# Patient Record
Sex: Male | Born: 1980 | Race: Black or African American | Hispanic: No | Marital: Single | Smoking: Current every day smoker
Health system: Southern US, Community
[De-identification: ages and names within clinical notes are randomized; demographics above are authoritative.]

---

## 1999-10-26 ENCOUNTER — Emergency Department (HOSPITAL_COMMUNITY): Admission: EM | Admit: 1999-10-26 | Discharge: 1999-10-26 | Payer: Self-pay | Admitting: *Deleted

## 2000-02-23 ENCOUNTER — Emergency Department (HOSPITAL_COMMUNITY): Admission: EM | Admit: 2000-02-23 | Discharge: 2000-02-23 | Payer: Self-pay | Admitting: *Deleted

## 2005-04-29 ENCOUNTER — Emergency Department (HOSPITAL_COMMUNITY): Admission: EM | Admit: 2005-04-29 | Discharge: 2005-04-29 | Payer: Self-pay | Admitting: Emergency Medicine

## 2006-12-24 ENCOUNTER — Emergency Department (HOSPITAL_COMMUNITY): Admission: EM | Admit: 2006-12-24 | Discharge: 2006-12-24 | Payer: Self-pay | Admitting: Emergency Medicine

## 2012-09-13 ENCOUNTER — Emergency Department (HOSPITAL_COMMUNITY)
Admission: EM | Admit: 2012-09-13 | Discharge: 2012-09-13 | Disposition: A | Discharge status: Home / Self Care | Payer: Self-pay | Attending: Emergency Medicine | Admitting: Emergency Medicine

## 2012-09-13 ENCOUNTER — Encounter (HOSPITAL_COMMUNITY): Payer: Self-pay

## 2012-09-13 DIAGNOSIS — R369 Urethral discharge, unspecified: Secondary | ICD-10-CM | POA: Insufficient documentation

## 2012-09-13 DIAGNOSIS — Z202 Contact with and (suspected) exposure to infections with a predominantly sexual mode of transmission: Secondary | ICD-10-CM | POA: Insufficient documentation

## 2012-09-13 DIAGNOSIS — R300 Dysuria: Secondary | ICD-10-CM | POA: Insufficient documentation

## 2012-09-13 DIAGNOSIS — F17200 Nicotine dependence, unspecified, uncomplicated: Secondary | ICD-10-CM | POA: Insufficient documentation

## 2012-09-13 MED ORDER — AZITHROMYCIN 250 MG PO TABS
1000.0000 mg | ORAL_TABLET | Freq: Once | ORAL | Status: AC
  Administered 2012-09-13: 1000 mg via ORAL
  Filled 2012-09-13: qty 4

## 2012-09-13 MED ORDER — CEFTRIAXONE SODIUM 250 MG IJ SOLR
250.0000 mg | Freq: Once | INTRAMUSCULAR | Status: AC
  Administered 2012-09-13: 250 mg via INTRAMUSCULAR
  Filled 2012-09-13: qty 250

## 2012-09-13 MED ORDER — LIDOCAINE HCL 2 % IJ SOLN
INTRAMUSCULAR | Status: AC
  Administered 2012-09-13: 1.3 mL
  Filled 2012-09-13: qty 20

## 2012-09-13 NOTE — ED Provider Notes (Signed)
History     CSN: [id number]  Arrival date & time 09/13/12  1403   First MD Initiated Contact with Patient 09/13/12 1521      Chief Complaint  Patient presents with  . Exposure to STD    (Consider location/radiation/quality/duration/timing/severity/associated sxs/prior treatment) HPI Comments: Patient presents with dysuria and yellow discharge X 1 week. Patient is sexually active with one male partner and does not use protection. He states that his signifcant other admitted to going to the doctor two weeks ago and being treated for a yeast infection. Denies fever or chills. Denies hematuria or flank pain. Denies genital sores or testicular swelling.  The history is provided by the patient. No language interpreter was used.    No past medical history on file.  No past surgical history on file.  No family history on file.  History  Substance Use Topics  . Smoking status: Current Every Day Smoker  . Smokeless tobacco: Never Used  . Alcohol Use: 0.6 oz/week    1 Cans of beer per week      Review of Systems  Constitutional: Negative for fever and chills.  Genitourinary: Positive for dysuria and discharge. Negative for hematuria, flank pain, scrotal swelling and genital sores.    Allergies  Review of patient's allergies indicates no known allergies.  Home Medications  No current outpatient prescriptions on file.  BP 145/94  Pulse 81  Temp 99 F (37.2 C) (Oral)  Resp 14  SpO2 100%  Physical Exam  Nursing note and vitals reviewed. Constitutional: He appears well-developed and well-nourished.  HENT:  Head: Normocephalic and atraumatic.  Mouth/Throat: Oropharynx is clear and moist.  Eyes: Conjunctivae normal are normal. No scleral icterus.  Cardiovascular: Normal rate, regular rhythm and normal heart sounds.   Pulmonary/Chest: Effort normal and breath sounds normal.  Abdominal: Soft. There is no tenderness.  Genitourinary: Testes normal.    Right testis  shows no swelling. Left testis shows no swelling. Circumcised. Discharge found.  Neurological: He is alert.  Skin: Skin is warm.    ED Course  Procedures (including critical care time)   Labs Reviewed  GC/CHLAMYDIA PROBE AMP, URINE   No results found.   1. Exposure to STD       MDM  Patient presented with complaint of dysuria and penile discharge. GC/chalmydia probe collected via urine. Patient treated with IM Rocephin and Zithromax in ED. Discharged with return precautions and recommendation to get tested for HIV and syphilis. Return precautions given verbally and in discharge summary.        Pixie Casino, PA-C 09/13/12 1554  Pixie Casino, PA-C 09/13/12 1733

## 2012-09-13 NOTE — Progress Notes (Signed)
CM spoke with pt who confirms self pay [place] resident with no pcp. Discussed the importance of a pcp for f/u. Reviewed [facility] number to assist with finding self pay provider close to pt's residence. Reviewed resources for [facility], general medical clinics, [email or web address], [facility] out patient [facility], housing, DSS, health Department and other resources in [place]. Pt voiced understanding and appreciation of resources provided 

## 2012-09-13 NOTE — ED Notes (Signed)
Pt reports burning, pain and discharge with urination.  Pt reports yellowish discharge.  Pt denies chills/fever, n/v.

## 2012-09-13 NOTE — ED Notes (Signed)
Pt verbalizes iunderstanding

## 2012-09-14 NOTE — ED Provider Notes (Signed)
Medical screening examination/treatment/procedure(s) were performed by non-physician practitioner and as supervising physician I was immediately available for consultation/collaboration  Yousof Alderman R. Waino Mounsey, MD 09/14/12 0012 

## 2015-02-28 ENCOUNTER — Encounter (HOSPITAL_COMMUNITY): Payer: Self-pay | Admitting: Emergency Medicine

## 2015-02-28 ENCOUNTER — Emergency Department (HOSPITAL_COMMUNITY): Payer: Self-pay

## 2015-02-28 ENCOUNTER — Emergency Department (HOSPITAL_COMMUNITY)
Admission: EM | Admit: 2015-02-28 | Discharge: 2015-02-28 | Disposition: A | Discharge status: Home / Self Care | Payer: Self-pay | Attending: Emergency Medicine | Admitting: Emergency Medicine

## 2015-02-28 DIAGNOSIS — Y998 Other external cause status: Secondary | ICD-10-CM | POA: Insufficient documentation

## 2015-02-28 DIAGNOSIS — Z720 Tobacco use: Secondary | ICD-10-CM | POA: Insufficient documentation

## 2015-02-28 DIAGNOSIS — S40021A Contusion of right upper arm, initial encounter: Secondary | ICD-10-CM | POA: Insufficient documentation

## 2015-02-28 DIAGNOSIS — S5011XA Contusion of right forearm, initial encounter: Secondary | ICD-10-CM | POA: Insufficient documentation

## 2015-02-28 DIAGNOSIS — Y92320 Baseball field as the place of occurrence of the external cause: Secondary | ICD-10-CM | POA: Insufficient documentation

## 2015-02-28 DIAGNOSIS — W2111XA Struck by baseball bat, initial encounter: Secondary | ICD-10-CM | POA: Insufficient documentation

## 2015-02-28 DIAGNOSIS — Y9364 Activity, baseball: Secondary | ICD-10-CM | POA: Insufficient documentation

## 2015-02-28 DIAGNOSIS — S42431A Displaced fracture (avulsion) of lateral epicondyle of right humerus, initial encounter for closed fracture: Secondary | ICD-10-CM | POA: Insufficient documentation

## 2015-02-28 MED ORDER — KETOROLAC TROMETHAMINE 60 MG/2ML IM SOLN
60.0000 mg | Freq: Once | INTRAMUSCULAR | Status: AC
  Administered 2015-02-28: 60 mg via INTRAMUSCULAR
  Filled 2015-02-28: qty 2

## 2015-02-28 MED ORDER — CYCLOBENZAPRINE HCL 10 MG PO TABS: 10.0000 mg | ORAL_TABLET | Freq: Two times a day (BID) | ORAL | Status: DC | PRN

## 2015-02-28 MED ORDER — NAPROXEN 375 MG PO TABS: 375.0000 mg | ORAL_TABLET | Freq: Two times a day (BID) | ORAL | Status: DC

## 2015-02-28 MED ORDER — OXYCODONE-ACETAMINOPHEN 5-325 MG PO TABS: 1.0000 | ORAL_TABLET | Freq: Four times a day (QID) | ORAL | Status: DC | PRN
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
## 2015-02-28 NOTE — Discharge Instructions (Signed)
Elbow Fracture, Simple A fracture is a break in one of the bones.When fractures are not displaced or separated, they may be treated with only a sling or splint. The sling or splint may only be required for two to three weeks. In these cases, often the elbow is put through early range of motion exercises to prevent the elbow from getting stiff. DIAGNOSIS  The diagnosis (learning what is wrong) of a fractured elbow is made by x-ray. These may be required before and after the elbow is put into a splint or cast. X-rays are taken after to make sure the bone pieces have not moved. HOME CARE INSTRUCTIONS   Only take over-the-counter or prescription medicines for pain, discomfort, or fever as directed by your caregiver.  If you have a splint held on with an elastic wrap and your hand or fingers become numb or cold and blue, loosen the wrap and reapply more loosely. See your caregiver if there is no relief.  You may use ice for twenty minutes, four times per day, for the first two to three days.  Use your elbow as directed.  See your caregiver as directed. It is very important to keep all follow-up referrals and appointments in order to avoid any long-term problems with your elbow including chronic pain or stiffness. SEEK IMMEDIATE MEDICAL CARE IF:   There is swelling or increasing pain in elbow.  You begin to lose feeling or experience numbness or tingling in your hand or fingers.  You develop swelling of the hand and fingers.  You get a cold or blue hand or fingers on affected side. MAKE SURE YOU:   Understand these instructions.  Will watch your condition.  Will get help right away if you are not doing well or get worse. Document Released: 10/28/2001 Document Revised: 01/26/2012 Document Reviewed: 09/18/2009 [facility] Patient Information 2015 [employer], [place]. This information is not intended to replace advice given to you by your health care provider. Make sure you discuss any questions  you have with your health care provider. RICE: Routine Care for Injuries The routine care of many injuries includes Rest, Ice, Compression, and Elevation (RICE). HOME CARE INSTRUCTIONS  Rest is needed to allow your body to heal. Routine activities can usually be resumed when comfortable. Injured tendons and bones can take up to 6 weeks to heal. Tendons are the cord-like structures that attach muscle to bone.  Ice following an injury helps keep the swelling down and reduces pain.  Put ice in a plastic bag.  Place a towel between your skin and the bag.  Leave the ice on for 15-20 minutes, 3-4 times a day, or as directed by your health care provider. Do this while awake, for the first 24 to 48 hours. After that, continue as directed by your caregiver.  Compression helps keep swelling down. It also gives support and helps with discomfort. If an elastic bandage has been applied, it should be removed and reapplied every 3 to 4 hours. It should not be applied tightly, but firmly enough to keep swelling down. Watch fingers or toes for swelling, bluish discoloration, coldness, numbness, or excessive pain. If any of these problems occur, remove the bandage and reapply loosely. Contact your caregiver if these problems continue.  Elevation helps reduce swelling and decreases pain. With extremities, such as the arms, hands, legs, and feet, the injured area should be placed near or above the level of the heart, if possible. SEEK IMMEDIATE MEDICAL CARE IF:  You have persistent  pain and swelling.  You develop redness, numbness, or unexpected weakness.  Your symptoms are getting worse rather than improving after several days. These symptoms may indicate that further evaluation or further X-rays are needed. Sometimes, X-rays may not show a small broken bone (fracture) until 1 week or 10 days later. Make a follow-up appointment with your caregiver. Ask when your X-ray results will be ready. Make sure you get  your X-ray results. Document Released: 02/15/2001 Document Revised: 11/08/2013 Document Reviewed: 04/04/2011 [facility] Patient Information 2015 [place], [place]. This information is not intended to replace advice given to you by your health care provider. Make sure you discuss any questions you have with your health care provider.
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
## 2016-05-12 ENCOUNTER — Encounter (HOSPITAL_COMMUNITY): Payer: Self-pay | Admitting: Emergency Medicine

## 2016-05-12 ENCOUNTER — Emergency Department (HOSPITAL_COMMUNITY)
Admission: EM | Admit: 2016-05-12 | Discharge: 2016-05-12 | Disposition: A | Discharge status: Home / Self Care | Payer: Self-pay | Attending: Emergency Medicine | Admitting: Emergency Medicine

## 2016-05-12 DIAGNOSIS — F17210 Nicotine dependence, cigarettes, uncomplicated: Secondary | ICD-10-CM | POA: Insufficient documentation

## 2016-05-12 DIAGNOSIS — K4090 Unilateral inguinal hernia, without obstruction or gangrene, not specified as recurrent: Secondary | ICD-10-CM | POA: Insufficient documentation

## 2016-05-12 MED ORDER — TRAMADOL HCL 50 MG PO TABS: 50.0000 mg | ORAL_TABLET | Freq: Four times a day (QID) | ORAL | Status: AC | PRN

## 2016-05-12 NOTE — ED Notes (Signed)
Pt c/o anterior "knot" to pelvic area onset about 3 weeks ago. Soft round mass bulging from left groin. Pt attempted to manually reduce without success.

## 2016-05-12 NOTE — Discharge Instructions (Signed)

## 2016-05-12 NOTE — ED Provider Notes (Signed)
CSN: [id number]     Arrival date & time 05/12/16  1814 History   First MD Initiated Contact with Patient 05/12/16 2031     Chief Complaint  Patient presents with  . Inguinal Hernia     (Consider location/radiation/quality/duration/timing/severity/associated sxs/prior Treatment) HPI    35yM with L groin pain. Onset about 3 weeks ago. Waxes/wanes. Sometimes feels a small mass there.Symptoms aggravated with movement. No fever or chills. No n/v. No urinary complaints. no discharge. No rash.            History reviewed. No pertinent past medical history. History reviewed. No pertinent past surgical history. History reviewed. No pertinent family history. Social History  Substance Use Topics  . Smoking status: Current Every Day Smoker -- 0.50 packs/day    Types: Cigarettes  . Smokeless tobacco: Never Used  . Alcohol Use: 0.6 oz/week    1 Cans of beer per week    Review of Systems  All systems reviewed and negative, other than as noted in HPI.   Allergies  Review of patient's allergies indicates no known allergies.  Home Medications   Prior to Admission medications   Not on File   BP 167/118 mmHg  Pulse 67  Temp(Src) 98.8 F (37.1 C) (Oral)  Resp 16  SpO2 100% Physical Exam  Constitutional: He appears well-developed and well-nourished. No distress.  HENT:  Head: Normocephalic and atraumatic.  Eyes: Conjunctivae are normal. Right eye exhibits no discharge. Left eye exhibits no discharge.  Neck: Neck supple.  Cardiovascular: Normal rate, regular rhythm and normal heart sounds.  Exam reveals no gallop and no friction rub.   No murmur heard. Pulmonary/Chest: Effort normal and breath sounds normal. No respiratory distress.  Abdominal: Soft. He exhibits no distension. There is no tenderness.  Genitourinary:  L inguinal hernia. Spontaneously reduces. No overlying skin changes.   Musculoskeletal: He exhibits no edema or tenderness.  Neurological: He is alert.  Skin:  Skin is warm and dry.  Psychiatric: He has a normal mood and affect. His behavior is normal. Thought content normal.  Nursing note and vitals reviewed.   ED Course  Procedures (including critical care time) Labs Review Labs Reviewed - No data to display  Imaging Review No results found. I have personally reviewed and evaluated these images and lab results as part of my medical decision-making.   EKG Interpretation None      MDM   Final diagnoses:  Left inguinal hernia    35 year old male with symptoms and exam consistent with left inguinal hernia. Incarceration/strangulation. Plan symptomatic treatment of his pain. General surgical follow-up information provided. Noted to be significantly hypertensive. Pt advised that needs to also establish PCP or at the very least needs to have it rechecked and evaluated if persistently elevated.     Raeford RazorStephen Marieli Rudy, MD 05/27/16 1006

## 2018-01-11 DIAGNOSIS — Z0000 Encounter for general adult medical examination without abnormal findings: Secondary | ICD-10-CM | POA: Diagnosis not present

## 2018-08-31 ENCOUNTER — Ambulatory Visit: Payer: Self-pay | Admitting: Surgery

## 2018-08-31 DIAGNOSIS — Z720 Tobacco use: Secondary | ICD-10-CM

## 2018-08-31 DIAGNOSIS — K4030 Unilateral inguinal hernia, with obstruction, without gangrene, not specified as recurrent: Secondary | ICD-10-CM

## 2018-08-31 DIAGNOSIS — K4090 Unilateral inguinal hernia, without obstruction or gangrene, not specified as recurrent: Secondary | ICD-10-CM | POA: Diagnosis not present

## 2018-08-31 NOTE — H&P (Signed)
Colton Gardner Documented: 08/31/2018 8:40 AM Location: [facility] Patient #: [id number] DOB: 1981-08-18 Single / Language: [place] / Race: Black or African American Male  History of Present Illness Ardeth Sportsman MD; 08/31/2018 9:23 AM) The patient is a 37 year old male who presents with an inguinal hernia. Note for "Inguinal hernia": ` ` ` Patient sent for surgical consultation at the request of Dr Raeford Razor, [facility] ED  Chief Complaint: Left inguinal hernia ` ` The patient is a pleasant active male. He is noticed a lump down his groin for the past few years. He had episode of sharp pain that concern him. He went to the emergency room. Dr., diagnosed inguinal hernia. Surgical consultation offered. Initially held off but he is noticed it got larger. It seems to be stuck more often. Sometimes he can get it back down but lately has been out all the time. He gets discomfort there that seems to refer to his abdomen. We'll get some queasiness. He does smoke about a pack per day. Otherwise very healthy. Usually moves his bowels every day. No prior abdominal surgery. Can walk several miles without difficulty difficulty. Rather physically active. She will get some intermittent abdominal pain. Does not seem to be related to eating or spicy or heavier foods. Denies any heartburn or reflux. Most always starts in the groin and refers up. He occasionally has a few large beers a week but not every night. No history of binge drinking. No history of liver or pancreas problems. No history of heartburn or reflux. He's not really needed any over-the-counter medications for this. No history of diabetes or hypertension. No urinary problems.  No personal nor family history of GI/colon cancer, inflammatory bowel disease, irritable bowel syndrome, allergy such as Celiac Sprue, dietary/dairy problems, colitis, ulcers nor gastritis. No recent sick contacts/gastroenteritis. No  travel outside the country. No changes in diet. No dysphagia to solids or liquids. No significant heartburn or reflux. No hematochezia, hematemesis, coffee ground emesis. No evidence of prior gastric/peptic ulceration.  (Review of systems as stated in this history (HPI) or in the review of systems. Otherwise all other 12 point ROS are negative) ` ` `   Past Surgical History (Tanisha A. Manson Passey, RMA; 08/31/2018 8:40 AM) No pertinent past surgical history  Diagnostic Studies History (Tanisha A. Manson Passey, RMA; 08/31/2018 8:40 AM) Colonoscopy never  Allergies (Tanisha A. Manson Passey, RMA; 08/31/2018 8:41 AM) No Known Drug Allergies [08/31/2018]: Allergies Reconciled  Medication History (Tanisha A. Manson Passey, RMA; 08/31/2018 8:41 AM) No Current Medications Medications Reconciled  Social History (Tanisha A. Manson Passey, RMA; 08/31/2018 8:40 AM) Alcohol use Occasional alcohol use. Caffeine use Coffee. Illicit drug use Uses socially only. Tobacco use Current some day smoker.  Family History (Tanisha A. Manson Passey, RMA; 08/31/2018 8:40 AM) Hypertension Mother.  Other Problems (Tanisha A. Manson Passey, RMA; 08/31/2018 8:40 AM) No pertinent past medical history     Review of Systems (Tanisha A. Brown RMA; 08/31/2018 8:40 AM) General Not Present- Appetite Loss, Chills, Fatigue, Fever, Night Sweats, Weight Gain and Weight Loss. Skin Not Present- Change in Wart/Mole, Dryness, Hives, Jaundice, New Lesions, Non-Healing Wounds, Rash and Ulcer. HEENT Not Present- Earache, Hearing Loss, Hoarseness, Nose Bleed, Oral Ulcers, Ringing in the Ears, Seasonal Allergies, Sinus Pain, Sore Throat, Visual Disturbances, Wears glasses/contact lenses and Yellow Eyes. Respiratory Not Present- Bloody sputum, Chronic Cough, Difficulty Breathing, Snoring and Wheezing. Breast Not Present- Breast Mass, Breast Pain, Nipple Discharge and Skin Changes. Cardiovascular Not Present- Chest Pain, Difficulty Breathing Lying Down,  Leg  Cramps, Palpitations, Rapid Heart Rate, Shortness of Breath and Swelling of Extremities. Gastrointestinal Present- Abdominal Pain. Not Present- Bloating, Bloody Stool, Change in Bowel Habits, Chronic diarrhea, Constipation, Difficulty Swallowing, Excessive gas, Gets full quickly at meals, Hemorrhoids, Indigestion, Nausea, Rectal Pain and Vomiting. Male Genitourinary Not Present- Blood in Urine, Change in Urinary Stream, Frequency, Impotence, Nocturia, Painful Urination, Urgency and Urine Leakage. Musculoskeletal Not Present- Back Pain, Joint Pain, Joint Stiffness, Muscle Pain, Muscle Weakness and Swelling of Extremities. Neurological Not Present- Decreased Memory, Fainting, Headaches, Numbness, Seizures, Tingling, Tremor, Trouble walking and Weakness. Psychiatric Not Present- Anxiety, Bipolar, Change in Sleep Pattern, Depression, Fearful and Frequent crying. Endocrine Not Present- Cold Intolerance, Excessive Hunger, Hair Changes, Heat Intolerance, Hot flashes and New Diabetes. Hematology Not Present- Blood Thinners, Easy Bruising, Excessive bleeding, Gland problems, HIV and Persistent Infections.  Vitals (Tanisha A. Brown RMA; 08/31/2018 8:41 AM) 08/31/2018 8:40 AM Weight: 181 lb Height: 69in Body Surface Area: 1.98 m Body Mass Index: 26.73 kg/m  Temp.: 98.79F  Pulse: 104 (Regular)  BP: 124/88 (Sitting, Left Arm, Standard)      Physical Exam Ardeth Sportsman MD; 08/31/2018 9:16 AM)  General Mental Status-Alert. General Appearance-Not in acute distress, Not Sickly. Orientation-Oriented X3. Hydration-Well hydrated. Voice-Normal.  Integumentary Global Assessment Upon inspection and palpation of skin surfaces of the - Axillae: non-tender, no inflammation or ulceration, no drainage. and Distribution of scalp and body hair is normal. General Characteristics Temperature - normal warmth is noted.  Head and Neck Head-normocephalic, atraumatic with no lesions or  palpable masses. Face Global Assessment - atraumatic, no absence of expression. Neck Global Assessment - no abnormal movements, no bruit auscultated on the right, no bruit auscultated on the left, no decreased range of motion, non-tender. Trachea-midline. Thyroid Gland Characteristics - non-tender.  Eye Eyeball - Left-Extraocular movements intact, No Nystagmus. Eyeball - Right-Extraocular movements intact, No Nystagmus. Cornea - Left-No Hazy. Cornea - Right-No Hazy. Sclera/Conjunctiva - Left-No scleral icterus, No Discharge. Sclera/Conjunctiva - Right-No scleral icterus, No Discharge. Pupil - Left-Direct reaction to light normal. Pupil - Right-Direct reaction to light normal. Note: Wears glasses. Vision corrected  ENMT Ears Pinna - Left - no drainage observed, no generalized tenderness observed. Right - no drainage observed, no generalized tenderness observed. Nose and Sinuses External Inspection of the Nose - no destructive lesion observed. Inspection of the nares - Left - quiet respiration. Right - quiet respiration. Mouth and Throat Lips - Upper Lip - no fissures observed, no pallor noted. Lower Lip - no fissures observed, no pallor noted. Nasopharynx - no discharge present. Oral Cavity/Oropharynx - Tongue - no dryness observed. Oral Mucosa - no cyanosis observed. Hypopharynx - no evidence of airway distress observed.  Chest and Lung Exam Inspection Movements - Normal and Symmetrical. Accessory muscles - No use of accessory muscles in breathing. Palpation Palpation of the chest reveals - Non-tender. Auscultation Breath sounds - Normal and Clear.  Cardiovascular Auscultation Rhythm - Regular. Murmurs & Other Heart Sounds - Auscultation of the heart reveals - No Murmurs and No Systolic Clicks.  Abdomen Inspection Inspection of the abdomen reveals - No Visible peristalsis and No Abnormal pulsations. Umbilicus - No Bleeding, No Urine  drainage. Palpation/Percussion Palpation and Percussion of the abdomen reveal - Soft, Non Tender, No Rebound tenderness, No Rigidity (guarding) and No Cutaneous hyperesthesia. Note: Abdomen soft. Nontender. Not distended. No umbilical or incisional hernias. No guarding.  Male Genitourinary Sexual Maturity Tanner 5 - Adult hair pattern and Adult penile size and shape. Note: Obvious  left scrotal bulge but eventually resected uses down to a moderate size inguinal hernia. Subtle impulse on right. Otherwise, normal external male genitalia.  Peripheral Vascular Upper Extremity Inspection - Left - No Cyanotic nailbeds, Not Ischemic. Right - No Cyanotic nailbeds, Not Ischemic.  Neurologic Neurologic evaluation reveals -normal attention span and ability to concentrate, able to name objects and repeat phrases. Appropriate fund of knowledge , normal sensation and normal coordination. Mental Status Affect - not angry, not paranoid. Cranial Nerves-Normal Bilaterally. Gait-Normal.  Neuropsychiatric Mental status exam performed with findings of-able to articulate well with normal speech/language, rate, volume and coherence, thought content normal with ability to perform basic computations and apply abstract reasoning and no evidence of hallucinations, delusions, obsessions or homicidal/suicidal ideation.  Musculoskeletal Global Assessment Spine, Ribs and Pelvis - no instability, subluxation or laxity. Right Upper Extremity - no instability, subluxation or laxity.  Lymphatic Head & Neck  General Head & Neck Lymphatics: Bilateral - Description - No Localized lymphadenopathy. Axillary  General Axillary Region: Bilateral - Description - No Localized lymphadenopathy. Femoral & Inguinal  Generalized Femoral & Inguinal Lymphatics: Left - Description - No Localized lymphadenopathy. Right - Description - No Localized lymphadenopathy.    Assessment & Plan Ardeth Sportsman MD; 08/31/2018  9:19 AM)  LEFT INGUINAL HERNIA (K40.90) Impression: Obvious left inguinal hernia going down scrotum. Somewhat reducible. Impulse on right suspicious for possible right inguinal hernia as well.  I think he would benefit from surgical repair. It seems to worsen and he started to get some referred pain to his abdomen with the rest of his differential diagnosis being unlikely for intra-abdominal problems. I am worried that a knuckle of small bowel is now within it.  He's interested in proceeding with surgical repair. Reasonable laparoscopic approach. Did caution that he will be out a few weeks for light duty at least & 6 weeks before unrestricted full intense activity.   PREOP - ING HERNIA - ENCOUNTER FOR PREOPERATIVE EXAMINATION FOR GENERAL SURGICAL PROCEDURE (Z01.818)  Current Plans You are being scheduled for surgery- Our schedulers will call you.  You should hear from our office's scheduling department within 5 working days about the location, date, and time of surgery. We try to make accommodations for patient's preferences in scheduling surgery, but sometimes the OR schedule or the surgeon's schedule prevents [place] from making those accommodations.  If you have not heard from our office [phone]) in 5 working days, call the office and ask for your surgeon's nurse.  If you have other questions about your diagnosis, plan, or surgery, call the office and ask for your surgeon's nurse.  Written instructions provided The anatomy & physiology of the abdominal wall and pelvic floor was discussed. The pathophysiology of hernias in the inguinal and pelvic region was discussed. Natural history risks such as progressive enlargement, pain, incarceration, and strangulation was discussed. Contributors to complications such as smoking, obesity, diabetes, prior surgery, etc were discussed.  I feel the risks of no intervention will lead to serious problems that outweigh the operative risks;  therefore, I recommended surgery to reduce and repair the hernia. I explained laparoscopic techniques with possible need for an open approach. I noted usual use of mesh to patch and/or buttress hernia repair  Risks such as bleeding, infection, abscess, need for further treatment, heart attack, death, and other risks were discussed. I noted a good likelihood this will help address the problem. Goals of post-operative recovery were discussed as well. Possibility that this will not correct all symptoms  was explained. I stressed the importance of low-impact activity, aggressive pain control, avoiding constipation, & not pushing through pain to minimize risk of post-operative chronic pain or injury. Possibility of reherniation was discussed. We will work to minimize complications.  An educational handout further explaining the pathology & treatment options was given as well. Questions were answered. The patient expresses understanding & wishes to proceed with surgery.  Pt Education - Pamphlet Given - Laparoscopic Hernia Repair: discussed with patient and provided information. Pt Education - [facility] Pain Control (Kamuela Magos) Pt Education - [facility] Hernia Post-Op HCI (Danie Diehl): discussed with patient and provided information. Pt Education - [facility] Mesh education: discussed with patient and provided information.  Ardeth Sportsman, MD, FACS, MASCRS Gastrointestinal and Minimally Invasive Surgery    [address] [place], [place] [address] [phone] Main / Paging [phone] Fax

## 2019-01-07 DIAGNOSIS — K4020 Bilateral inguinal hernia, without obstruction or gangrene, not specified as recurrent: Secondary | ICD-10-CM | POA: Diagnosis not present

## 2020-12-14 DIAGNOSIS — E782 Mixed hyperlipidemia: Secondary | ICD-10-CM | POA: Diagnosis not present

## 2020-12-14 DIAGNOSIS — Z0000 Encounter for general adult medical examination without abnormal findings: Secondary | ICD-10-CM | POA: Diagnosis not present

## 2022-04-12 DIAGNOSIS — L03011 Cellulitis of right finger: Secondary | ICD-10-CM | POA: Diagnosis not present

## 2023-03-09 DIAGNOSIS — I10 Essential (primary) hypertension: Secondary | ICD-10-CM | POA: Diagnosis not present

## 2023-03-09 DIAGNOSIS — M62838 Other muscle spasm: Secondary | ICD-10-CM | POA: Diagnosis not present

## 2023-04-08 DIAGNOSIS — I10 Essential (primary) hypertension: Secondary | ICD-10-CM | POA: Diagnosis not present

## 2023-09-09 DIAGNOSIS — Z6827 Body mass index (BMI) 27.0-27.9, adult: Secondary | ICD-10-CM | POA: Diagnosis not present

## 2023-09-09 DIAGNOSIS — I10 Essential (primary) hypertension: Secondary | ICD-10-CM | POA: Diagnosis not present

## 2023-09-09 DIAGNOSIS — R1030 Lower abdominal pain, unspecified: Secondary | ICD-10-CM | POA: Diagnosis not present

## 2023-09-09 DIAGNOSIS — R3915 Urgency of urination: Secondary | ICD-10-CM | POA: Diagnosis not present

## 2023-10-07 DIAGNOSIS — I10 Essential (primary) hypertension: Secondary | ICD-10-CM | POA: Diagnosis not present

## 2024-11-16 ENCOUNTER — Emergency Department (HOSPITAL_COMMUNITY)
Admission: EM | Admit: 2024-11-16 | Discharge: 2024-11-17 | Disposition: A | Discharge status: Home / Self Care | Attending: Emergency Medicine | Admitting: Emergency Medicine

## 2024-11-16 ENCOUNTER — Other Ambulatory Visit: Payer: Self-pay

## 2024-11-16 DIAGNOSIS — N451 Epididymitis: Secondary | ICD-10-CM | POA: Insufficient documentation

## 2024-11-16 DIAGNOSIS — N50812 Left testicular pain: Secondary | ICD-10-CM | POA: Diagnosis present

## 2024-11-16 DIAGNOSIS — N433 Hydrocele, unspecified: Secondary | ICD-10-CM | POA: Insufficient documentation

## 2024-11-16 NOTE — ED Triage Notes (Signed)
 Pt arrived via POV. Pt c/o left testicular swelling and pain for 2-3 days. Pt reports the swelling has gotten worse everyday. Pt reports no trouble with urination. Pt reports it hurts to walk

## 2024-11-17 ENCOUNTER — Emergency Department (HOSPITAL_COMMUNITY)

## 2024-11-17 LAB — URINALYSIS, W/ REFLEX TO CULTURE (INFECTION SUSPECTED)
Bilirubin Urine: NEGATIVE
Glucose, UA: NEGATIVE mg/dL
Hgb urine dipstick: NEGATIVE
Ketones, ur: NEGATIVE mg/dL
Nitrite: NEGATIVE
Protein, ur: 30 mg/dL — AB
Specific Gravity, Urine: 1.029 (ref 1.005–1.030)
pH: 5 (ref 5.0–8.0)

## 2024-11-17 LAB — SYPHILIS: RPR W/REFLEX TO RPR TITER AND TREPONEMAL ANTIBODIES, TRADITIONAL SCREENING AND DIAGNOSIS ALGORITHM: RPR Ser Ql: NONREACTIVE

## 2024-11-17 LAB — HIV ANTIBODY (ROUTINE TESTING W REFLEX): HIV Screen 4th Generation wRfx: NONREACTIVE

## 2024-11-17 MED ORDER — CEPHALEXIN 500 MG PO CAPS: 500.0000 mg | ORAL_CAPSULE | Freq: Four times a day (QID) | ORAL | 0 refills | Status: DC

## 2024-11-17 MED ORDER — CEPHALEXIN 500 MG PO CAPS: 500.0000 mg | ORAL_CAPSULE | Freq: Four times a day (QID) | ORAL | 0 refills | Status: AC

## 2024-11-17 MED ORDER — DOXYCYCLINE HYCLATE 100 MG PO TABS: 100.0000 mg | ORAL_TABLET | Freq: Two times a day (BID) | ORAL | 0 refills | Status: AC

## 2024-11-17 MED ORDER — CEFTRIAXONE SODIUM 1 G IJ SOLR
500.0000 mg | Freq: Once | INTRAMUSCULAR | Status: AC
  Administered 2024-11-17: 500 mg via INTRAMUSCULAR
  Filled 2024-11-17: qty 10

## 2024-11-17 MED ORDER — DOXYCYCLINE HYCLATE 100 MG PO TABS
100.0000 mg | ORAL_TABLET | Freq: Once | ORAL | Status: AC
  Administered 2024-11-17: 100 mg via ORAL
  Filled 2024-11-17: qty 1

## 2024-11-17 MED ORDER — LIDOCAINE HCL (PF) 1 % IJ SOLN
1.0000 mL | Freq: Once | INTRAMUSCULAR | Status: AC
  Administered 2024-11-17: 2.1 mL
  Filled 2024-11-17: qty 30

## 2024-11-17 NOTE — ED Provider Notes (Signed)
 " [place] EMERGENCY DEPARTMENT AT [facility] Provider Note   CSN: [id number] Arrival date & time: 11/16/24  2313     Patient presents with: No chief complaint on file.   Colton Gardner is a 44 y.o. male.  Patient with no relevant past medical history on file presents to the emergency room complaining of left-sided testicular pain and swelling.  He reports 2 to 3 days of worsening pain with pain with walking.  He denies dysuria, penile discharge.  He states he is in a monogamous sexual relationship.   HPI     Prior to Admission medications  Medication Sig Start Date End Date Taking? Authorizing Provider  doxycycline (VIBRA-TABS) 100 MG tablet Take 1 tablet (100 mg total) by mouth 2 (two) times daily. 11/17/24  Yes Logan Martinis B, PA-C  traMADol  (ULTRAM ) 50 MG tablet Take 1 tablet (50 mg total) by mouth every 6 (six) hours as needed. 05/12/16   Loetta Senior, MD    Allergies: Patient has no known allergies.    Review of Systems  Updated Vital Signs BP (!) 177/113   Pulse (!) 109   Temp 98.8 F (37.1 C) (Oral)   Resp 15   SpO2 100%   Physical Exam Vitals and nursing note reviewed.  HENT:     Head: Normocephalic and atraumatic.  Eyes:     Pupils: Pupils are equal, round, and reactive to light.  Cardiovascular:     Rate and Rhythm: Normal rate.  Pulmonary:     Effort: Pulmonary effort is normal. No respiratory distress.  Genitourinary:    Comments: Swollen left testicle, tender to palpation.  Pain worsens with elevation. Musculoskeletal:        General: No signs of injury.     Cervical back: Normal range of motion.  Skin:    General: Skin is dry.  Neurological:     Mental Status: He is alert.  Psychiatric:        Speech: Speech normal.        Behavior: Behavior normal.     (all labs ordered are listed, but only abnormal results are displayed) Labs Reviewed  GC/CHLAMYDIA PROBE AMP ([place]) NOT AT [facility]    EKG: None  Radiology: No  results found.   Procedures   Medications Ordered in the ED  cefTRIAXone  (ROCEPHIN ) injection 500 mg (500 mg Intramuscular Given 11/17/24 0054)  lidocaine  (PF) (XYLOCAINE ) 1 % injection 1-2.1 mL (2.1 mLs Other Given 11/17/24 0057)  doxycycline (VIBRA-TABS) tablet 100 mg (100 mg Oral Given 11/17/24 0053)                                    Medical Decision Making Amount and/or Complexity of Data Reviewed Radiology: ordered.  Risk Prescription drug management.   This patient presents to the ED for concern of testicle pain, this involves an extensive number of treatment options, and is a complaint that carries with it a high risk of complications and morbidity.  The differential diagnosis includes testicular torsion, epididymitis, orchitis, others   Co morbidities / Chronic conditions that complicate the patient evaluation  History of left inguinal hernia   Lab Tests:  GC chlamydia probe ordered   Imaging Studies ordered:  I ordered imaging studies including scrotal ultrasound with Doppler  Problem List / ED Course / Critical interventions / Medication management   I ordered medication including Rocephin , doxycycline Reevaluation of the  patient after these medicines showed that the patient stayed the same   Test / Admission - Considered:  Patient with testicular pain.  Unable to rule out torsion without ultrasound which is unfortunately unavailable at our facility this evening.  Dr. Lorette at the [facility] emergency department agrees with patient transfer via POV for ultrasound study.  If negative for torsion, plan to treat for likely epididymitis.  Patient was treated with Rocephin  and doxycycline here at the emergency department.  A prescription has been provided for doxycycline as an outpatient and patient may follow-up with urology as needed.  Disposition pending results of ultrasound but if negative for torsion patient may follow up as an outpatient.       Final  diagnoses:  Pain in left testicle    ED Discharge Orders          Ordered    doxycycline (VIBRA-TABS) 100 MG tablet  2 times daily        11/17/24 0032               Logan Ubaldo NOVAK, PA-C 11/17/24 0102    Emil Share, DO 11/17/24 [id number]  "

## 2024-11-17 NOTE — ED Provider Notes (Signed)
 Patient transferred from [facility] for US  to r/o torsion.SABRA  Ultrasound here is  reassuring does have hydrocele and epididymitis.  Patient history of unprotected intercourse and is already started antibiotics so no need to stay around for his urine testing as he is covered for everything besides syphilis and HIV already. Will fu w/ urology if not improving.    Ary Lavine, Selinda, MD 11/18/24 [phone]

## 2024-11-17 NOTE — Discharge Instructions (Addendum)
 You were seen this evening for left-sided testicular pain.  Please proceed immediately to [facility] emergency department and let them know you are there is a transfer from [facility] for an ultrasound.  If your ultrasound is negative for testicular torsion you would likely be able to discharge home and take the prescribed antibiotics.  You will be able to follow-up with urology as an outpatient.  If positive you will need further care while in the hospital.

## 2024-11-18 LAB — GC/CHLAMYDIA PROBE AMP (~~LOC~~) NOT AT ARMC
Chlamydia: NEGATIVE
Chlamydia: NEGATIVE
Comment: NEGATIVE
Comment: NEGATIVE
Comment: NORMAL
Comment: NORMAL
Neisseria Gonorrhea: NEGATIVE
Neisseria Gonorrhea: NEGATIVE
